# Patient Record
Sex: Female | Born: 1960 | Race: White | Hispanic: No | Marital: Married | Smoking: Current some day smoker
Health system: Southern US, Community
[De-identification: ages and names within clinical notes are randomized; demographics above are authoritative.]

## PROBLEM LIST (undated history)

## (undated) DIAGNOSIS — N644 Mastodynia: Secondary | ICD-10-CM

---

## 2004-10-10 ENCOUNTER — Ambulatory Visit: Payer: Self-pay | Admitting: Obstetrics and Gynecology

## 2004-11-08 ENCOUNTER — Emergency Department (HOSPITAL_COMMUNITY): Admission: EM | Admit: 2004-11-08 | Discharge: 2004-11-08 | Payer: Self-pay | Admitting: Emergency Medicine

## 2005-12-01 ENCOUNTER — Ambulatory Visit: Payer: Self-pay | Admitting: Obstetrics and Gynecology

## 2007-02-03 ENCOUNTER — Ambulatory Visit: Payer: Self-pay | Admitting: Obstetrics and Gynecology

## 2008-02-28 ENCOUNTER — Ambulatory Visit: Payer: Self-pay | Admitting: Obstetrics and Gynecology

## 2009-07-12 ENCOUNTER — Ambulatory Visit: Payer: Self-pay | Admitting: Obstetrics and Gynecology

## 2009-07-24 ENCOUNTER — Ambulatory Visit: Payer: Self-pay | Admitting: Obstetrics and Gynecology

## 2010-09-23 ENCOUNTER — Ambulatory Visit: Payer: Self-pay | Admitting: Obstetrics and Gynecology

## 2011-07-15 ENCOUNTER — Ambulatory Visit: Payer: Self-pay

## 2011-11-05 ENCOUNTER — Ambulatory Visit: Payer: Self-pay | Admitting: Obstetrics and Gynecology

## 2013-01-11 ENCOUNTER — Ambulatory Visit: Payer: Self-pay | Admitting: Obstetrics and Gynecology

## 2014-07-03 ENCOUNTER — Ambulatory Visit: Payer: Self-pay | Admitting: Obstetrics and Gynecology

## 2015-09-12 ENCOUNTER — Other Ambulatory Visit: Payer: Self-pay | Admitting: Obstetrics and Gynecology

## 2015-09-12 DIAGNOSIS — N63 Unspecified lump in unspecified breast: Secondary | ICD-10-CM

## 2015-09-24 ENCOUNTER — Ambulatory Visit
Admission: RE | Admit: 2015-09-24 | Discharge: 2015-09-24 | Disposition: A | Discharge status: Home / Self Care | Payer: BC Managed Care – PPO | Source: Ambulatory Visit | Attending: Obstetrics and Gynecology | Admitting: Obstetrics and Gynecology

## 2015-09-24 DIAGNOSIS — N63 Unspecified lump in unspecified breast: Secondary | ICD-10-CM

## 2015-09-24 DIAGNOSIS — R928 Other abnormal and inconclusive findings on diagnostic imaging of breast: Secondary | ICD-10-CM | POA: Diagnosis not present

## 2015-09-24 HISTORY — DX: Mastodynia: N64.4

## 2016-10-06 ENCOUNTER — Other Ambulatory Visit: Payer: Self-pay | Admitting: Obstetrics and Gynecology

## 2016-10-06 DIAGNOSIS — Z1231 Encounter for screening mammogram for malignant neoplasm of breast: Secondary | ICD-10-CM

## 2016-11-04 ENCOUNTER — Ambulatory Visit
Admission: RE | Admit: 2016-11-04 | Discharge: 2016-11-04 | Disposition: A | Discharge status: Home / Self Care | Payer: BC Managed Care – PPO | Source: Ambulatory Visit | Attending: Obstetrics and Gynecology | Admitting: Obstetrics and Gynecology

## 2016-11-04 DIAGNOSIS — Z1231 Encounter for screening mammogram for malignant neoplasm of breast: Secondary | ICD-10-CM | POA: Insufficient documentation

## 2017-05-12 ENCOUNTER — Emergency Department: Payer: BC Managed Care – PPO

## 2017-05-12 ENCOUNTER — Encounter: Payer: Self-pay | Admitting: *Deleted

## 2017-05-12 ENCOUNTER — Emergency Department
Admission: EM | Admit: 2017-05-12 | Discharge: 2017-05-12 | Disposition: A | Discharge status: Home / Self Care | Payer: BC Managed Care – PPO | Attending: Emergency Medicine | Admitting: Emergency Medicine

## 2017-05-12 DIAGNOSIS — N200 Calculus of kidney: Secondary | ICD-10-CM | POA: Diagnosis not present

## 2017-05-12 DIAGNOSIS — R109 Unspecified abdominal pain: Secondary | ICD-10-CM | POA: Diagnosis present

## 2017-05-12 DIAGNOSIS — F17210 Nicotine dependence, cigarettes, uncomplicated: Secondary | ICD-10-CM | POA: Diagnosis not present

## 2017-05-12 LAB — URINALYSIS, COMPLETE (UACMP) WITH MICROSCOPIC
BILIRUBIN URINE: NEGATIVE
Bacteria, UA: NONE SEEN
GLUCOSE, UA: NEGATIVE mg/dL
KETONES UR: 5 mg/dL — AB
LEUKOCYTES UA: NEGATIVE
Nitrite: POSITIVE — AB
PH: 5 (ref 5.0–8.0)
PROTEIN: NEGATIVE mg/dL
Specific Gravity, Urine: 1.011 (ref 1.005–1.030)

## 2017-05-12 MED ORDER — ONDANSETRON 4 MG PO TBDP
4.0000 mg | ORAL_TABLET | Freq: Once | ORAL | Status: AC
  Administered 2017-05-12: 4 mg via ORAL
  Filled 2017-05-12: qty 1

## 2017-05-12 MED ORDER — OXYCODONE-ACETAMINOPHEN 5-325 MG PO TABS
1.0000 | ORAL_TABLET | Freq: Once | ORAL | Status: AC
  Administered 2017-05-12: 1 via ORAL
  Filled 2017-05-12: qty 1

## 2017-05-12 MED ORDER — KETOROLAC TROMETHAMINE 30 MG/ML IJ SOLN
30.0000 mg | Freq: Once | INTRAMUSCULAR | Status: AC
  Administered 2017-05-12: 30 mg via INTRAMUSCULAR
  Filled 2017-05-12: qty 1

## 2017-05-12 MED ORDER — OXYCODONE-ACETAMINOPHEN 5-325 MG PO TABS: 1.0000 | ORAL_TABLET | Freq: Four times a day (QID) | ORAL | 0 refills | Status: AC | PRN

## 2017-05-12 MED ORDER — ONDANSETRON 4 MG PO TBDP: 4.0000 mg | ORAL_TABLET | Freq: Three times a day (TID) | ORAL | 0 refills | Status: AC | PRN

## 2017-05-12 NOTE — Discharge Instructions (Addendum)
Please keep your appointment to see Dr. Yves Dill tomorrow as scheduled. Return to the emergency department for any concerns such as fevers, chills, worsening pain, or for any other issues.  Please stop taking your ciprofloxacin - you do not have a urinary tract infection.  You can also take 600 mg of ibuprofen 3 times a day as needed for pain and only use your Percocet as needed for severe pain.  It was a pleasure to take care of you today, and thank you for coming to our emergency department.  If you have any questions or concerns before leaving please ask the nurse to grab me and I'm more than happy to go through your aftercare instructions again.  If you were prescribed any opioid pain medication today such as Norco, Vicodin, Percocet, morphine, hydrocodone, or oxycodone please make sure you do not drive when you are taking this medication as it can alter your ability to drive safely.  If you have any concerns once you are home that you are not improving or are in fact getting worse before you can make it to your follow-up appointment, please do not hesitate to call 911 and come back for further evaluation.  Darel Hong, MD  No results found for this or any previous visit. Ct Renal Stone Study  Result Date: 05/12/2017 CLINICAL DATA:  Left flank pain for several days. Vomiting. No history of stones. Recently treated for urinary tract infection. Previous cholecystectomy. EXAM: CT ABDOMEN AND PELVIS WITHOUT CONTRAST TECHNIQUE: Multidetector CT imaging of the abdomen and pelvis was performed following the standard protocol without IV contrast. COMPARISON:  None. FINDINGS: Lower chest: No pulmonary nodules, pleural effusions, or infiltrates. Heart size is normal. No imaged pericardial effusion or significant coronary artery calcifications. Hepatobiliary: Status post cholecystectomy. Small left hepatic lobe cyst is identified at the dome. Pancreas: Unremarkable. No pancreatic ductal dilatation or  surrounding inflammatory changes. Spleen: Normal in size without focal abnormality. Adrenals/Urinary Tract: The adrenal glands are normal in appearance. There is left-sided hydronephrosis and hydroureter secondary to a 3 mm calculus at the distal most ureterovesical junction. There is significant edema in the region of the UVJ. A lower pole 3 mm intrarenal calculus is identified in the right kidney, not associated with obstruction. There is no right-sided hydronephrosis. No renal mass. Stomach/Bowel: The stomach and small bowel loops are normal in appearance. There are numerous colonic diverticula. No acute diverticulitis. The appendix is well seen and has a normal appearance. Vascular/Lymphatic: There is atherosclerotic calcification of the abdominal aorta not associated with aneurysm. No retroperitoneal or mesenteric adenopathy. Reproductive: Uterus is present and normal in CT appearance. No adnexal mass. Other: No free pelvic fluid. Anterior abdominal wall is unremarkable. Musculoskeletal: No acute or significant osseous findings. IMPRESSION: 1. Obstructing left ureterovesical junction calculus measuring 3 mm. 2. Nonobstructing lower pole right renal calculus. 3. Status post cholecystectomy. 4.  Aortic atherosclerosis.  (ICD10-I70.0) 5. Normal appendix. Electronically Signed   By: Nolon Nations M.D.   On: 05/12/2017 22:02

## 2017-05-12 NOTE — ED Notes (Signed)
Pt states Saturday felt like she had a UTI, states pressure started Sunday. States pain started LLQ/pelvic. Went to walk in clinic and was told blood in urine and bacteria. Was told she might had a kidney stone, denies hx of kidney stones. States has had UTI before and states this feels worse. Pt is holding L flank currently, lying on R side. States N&V as well. Alert, oriented, ambulatory.

## 2017-05-12 NOTE — ED Provider Notes (Signed)
[facility] Emergency Department Provider Note  ____________________________________________   First MD Initiated Contact with Patient 05/12/17 2123     (approximate)  I have reviewed the triage vital signs and the nursing notes.   HISTORY  Chief Complaint Flank Pain    HPI Raven Garza is a 56 y.o. female ho comes to the emergency Department with roughly 24 hours of severe left flank pain radiating to her left groin. She had several days of dysuria and yesterday went to the walk-in clinic where she had urinalysis positive for blood and a small amount of bacteria and she was begun on ciprofloxacin for presumed infection.No fevers or chills. She has no history of renal colic. Her pain is been slowly progressive and is now severe. Nothing seems to make it better or worse.she made an appointment to follow-up with urology tomorrow however tonight the pain was so severe she could not wait.   Past Medical History:  Diagnosis Date  . Breast pain x 1 month   not as painful currently    There are no active problems to display for this patient.   No past surgical history on file.  Prior to Admission medications   Medication Sig Start Date End Date Taking? Authorizing Provider  ondansetron (ZOFRAN ODT) 4 MG disintegrating tablet Take 1 tablet (4 mg total) by mouth every 8 (eight) hours as needed for nausea or vomiting. 05/12/17   Darel Hong, MD  oxyCODONE-acetaminophen (ROXICET) 5-325 MG tablet Take 1 tablet by mouth every 6 (six) hours as needed for severe pain. 05/12/17   Darel Hong, MD    Allergies Patient has no known allergies.  Family History  Problem Relation Age of Onset  . Ovarian cancer Mother 70  . Breast cancer Cousin 27    Social History Social History  Substance Use Topics  . Smoking status: Current Some Day Smoker  . Smokeless tobacco: Never Used  . Alcohol use No    Review of Systems Constitutional: No  fever/chills Eyes: No visual changes. ENT: No sore throat. Cardiovascular: Denies chest pain. Respiratory: Denies shortness of breath. Gastrointestinal: No abdominal pain.  Positive nausea, no vomiting.  No diarrhea.  No constipation. Genitourinary: positive for dysuria. Musculoskeletal: positive for back pain. Skin: Negative for rash. Neurological: Negative for headaches, focal weakness or numbness.   ____________________________________________   PHYSICAL EXAM:  VITAL SIGNS: ED Triage Vitals  Enc Vitals Group     BP 05/12/17 2107 (!) 173/91     Pulse Rate 05/12/17 2107 98     Resp 05/12/17 2107 (!) 24     Temp --      Temp src --      SpO2 05/12/17 2107 97 %     Weight 05/12/17 2108 155 lb (70.3 kg)     Height 05/12/17 2108 5\' 4"  (1.626 m)     Head Circumference --      Peak Flow --      Pain Score 05/12/17 2112 9     Pain Loc --      Pain Edu? --      Excl. in [place]? --     Constitutional: alert and oriented 4 appears quite uncomfortable lying on her right side splinting and holding her left flank Eyes: PERRL EOMI. Head: Atraumatic. Nose: No congestion/rhinnorhea. Mouth/Throat: No trismus Neck: No stridor.   Cardiovascular: Normal rate, regular rhythm. Grossly normal heart sounds.  Good peripheral circulation. Respiratory: increased respiratory effort.  No retractions. Lungs CTAB and  moving good air Gastrointestinal: soft nondistended nontender no rebound or guarding no peritonitis no costovertebral tenderness Musculoskeletal: No lower extremity edema   Neurologic:  Normal speech and language. No gross focal neurologic deficits are appreciated. Skin:  Skin is warm, dry and intact. No rash noted. Psychiatric: Mood and affect are normal. Speech and behavior are normal.    ____________________________________________   DIFFERENTIAL includes but not limited to  Renal colic, pyelonephritis, urinary tract infection,  AAA ____________________________________________   LABS (all labs ordered are listed, but only abnormal results are displayed)  Labs Reviewed  URINALYSIS, COMPLETE (UACMP) WITH MICROSCOPIC - Abnormal; Notable for the following:       Result Value   Color, Urine AMBER (*)    APPearance CLEAR (*)    Hgb urine dipstick MODERATE (*)    Ketones, ur 5 (*)    Nitrite POSITIVE (*)    Squamous Epithelial / LPF 0-5 (*)    All other components within normal limits  URINE CULTURE     __________________________________________  EKG   ____________________________________________  RADIOLOGY  CT scan shows a 3 mm left sided kidney stone at the UVJ ____________________________________________   PROCEDURES  Procedure(s) performed: no  Procedures  Critical Care performed: no  Observation: no ____________________________________________   INITIAL IMPRESSION / ASSESSMENT AND PLAN / ED COURSE  Pertinent labs & imaging results that were available during my care of the patient were reviewed by me and considered in my medical decision making (see chart for details).  The patient's history and exam are most consistent with renal colic. I reviewed her urinalysis yesterday and it is negative for nitrites negative for leukocytes and has no white blood cells. I do not believe she has a urinary tract infection.     After Toradol and Percocet the patient's pain is nearly completely resolved. She has a 3 mm stone at the left UVJ. She already has an appointment to see the urologist tomorrow morning. Fever precautions given and she'll be discharged hoe with Percocet and Zofran. Her husband is driving. ____________________________________________   FINAL CLINICAL IMPRESSION(S) / ED DIAGNOSES  Final diagnoses:  Kidney stone      NEW MEDICATIONS STARTED DURING THIS VISIT:  Discharge Medication List as of 05/12/2017 10:19 PM    START taking these medications   Details  ondansetron  (ZOFRAN ODT) 4 MG disintegrating tablet Take 1 tablet (4 mg total) by mouth every 8 (eight) hours as needed for nausea or vomiting., Starting Wed 05/12/2017, Print    oxyCODONE-acetaminophen (ROXICET) 5-325 MG tablet Take 1 tablet by mouth every 6 (six) hours as needed for severe pain., Starting Wed 05/12/2017, Print         Note:  This document was prepared using Dragon voice recognition software and may include unintentional dictation errors.     Darel Hong, MD 05/12/17 2246

## 2017-05-12 NOTE — ED Triage Notes (Signed)
Pt has left flank pain.  Sx for several days.  Pt vomiting in triage.  No hx of kidney stones.  Pt seen in urgent care yesterday and treated for uti sx.

## 2017-05-14 LAB — URINE CULTURE: Culture: NO GROWTH

## 2017-10-07 ENCOUNTER — Other Ambulatory Visit: Payer: Self-pay | Admitting: Obstetrics and Gynecology

## 2017-10-07 DIAGNOSIS — Z1231 Encounter for screening mammogram for malignant neoplasm of breast: Secondary | ICD-10-CM

## 2017-11-05 ENCOUNTER — Ambulatory Visit
Admission: RE | Admit: 2017-11-05 | Discharge: 2017-11-05 | Disposition: A | Discharge status: Home / Self Care | Payer: BC Managed Care – PPO | Source: Ambulatory Visit | Attending: Obstetrics and Gynecology | Admitting: Obstetrics and Gynecology

## 2017-11-05 DIAGNOSIS — Z1231 Encounter for screening mammogram for malignant neoplasm of breast: Secondary | ICD-10-CM | POA: Insufficient documentation

## 2017-11-05 DIAGNOSIS — R928 Other abnormal and inconclusive findings on diagnostic imaging of breast: Secondary | ICD-10-CM | POA: Insufficient documentation

## 2017-11-11 ENCOUNTER — Other Ambulatory Visit: Payer: Self-pay | Admitting: Obstetrics and Gynecology

## 2017-11-11 DIAGNOSIS — R928 Other abnormal and inconclusive findings on diagnostic imaging of breast: Secondary | ICD-10-CM

## 2017-11-11 DIAGNOSIS — N6310 Unspecified lump in the right breast, unspecified quadrant: Secondary | ICD-10-CM

## 2017-11-12 ENCOUNTER — Ambulatory Visit
Admission: RE | Admit: 2017-11-12 | Discharge: 2017-11-12 | Disposition: A | Discharge status: Home / Self Care | Payer: BC Managed Care – PPO | Source: Ambulatory Visit | Attending: Obstetrics and Gynecology | Admitting: Obstetrics and Gynecology

## 2017-11-12 DIAGNOSIS — N6310 Unspecified lump in the right breast, unspecified quadrant: Secondary | ICD-10-CM

## 2017-11-12 DIAGNOSIS — N6314 Unspecified lump in the right breast, lower inner quadrant: Secondary | ICD-10-CM | POA: Diagnosis not present

## 2017-11-12 DIAGNOSIS — R928 Other abnormal and inconclusive findings on diagnostic imaging of breast: Secondary | ICD-10-CM | POA: Diagnosis present

## 2017-11-16 ENCOUNTER — Other Ambulatory Visit: Payer: Self-pay | Admitting: Obstetrics and Gynecology

## 2017-11-16 DIAGNOSIS — N6310 Unspecified lump in the right breast, unspecified quadrant: Secondary | ICD-10-CM

## 2017-11-16 DIAGNOSIS — R928 Other abnormal and inconclusive findings on diagnostic imaging of breast: Secondary | ICD-10-CM

## 2017-11-19 ENCOUNTER — Ambulatory Visit
Admission: RE | Admit: 2017-11-19 | Discharge: 2017-11-19 | Disposition: A | Discharge status: Home / Self Care | Payer: BC Managed Care – PPO | Source: Ambulatory Visit | Attending: Obstetrics and Gynecology | Admitting: Obstetrics and Gynecology

## 2017-11-19 DIAGNOSIS — D241 Benign neoplasm of right breast: Secondary | ICD-10-CM | POA: Insufficient documentation

## 2017-11-19 DIAGNOSIS — N6081 Other benign mammary dysplasias of right breast: Secondary | ICD-10-CM | POA: Insufficient documentation

## 2017-11-19 DIAGNOSIS — N6314 Unspecified lump in the right breast, lower inner quadrant: Secondary | ICD-10-CM | POA: Diagnosis present

## 2017-11-19 DIAGNOSIS — R928 Other abnormal and inconclusive findings on diagnostic imaging of breast: Secondary | ICD-10-CM

## 2017-11-19 DIAGNOSIS — N6310 Unspecified lump in the right breast, unspecified quadrant: Secondary | ICD-10-CM

## 2017-11-19 HISTORY — PX: BREAST BIOPSY: SHX20

## 2017-11-22 LAB — SURGICAL PATHOLOGY

## 2017-12-20 ENCOUNTER — Other Ambulatory Visit (HOSPITAL_COMMUNITY): Payer: Self-pay | Admitting: General Surgery

## 2017-12-20 DIAGNOSIS — D241 Benign neoplasm of right breast: Secondary | ICD-10-CM

## 2018-09-02 IMAGING — MG MM DIGITAL SCREENING BILAT W/ TOMO W/ CAD
8 of 13 series · 8 of 29 positions shown · non-contrast
Comparison: Previous exam(s).

CLINICAL DATA: Screening.

EXAM:
DIGITAL SCREENING BILATERAL MAMMOGRAM WITH TOMO AND CAD

[L MLO (1 of 2)]
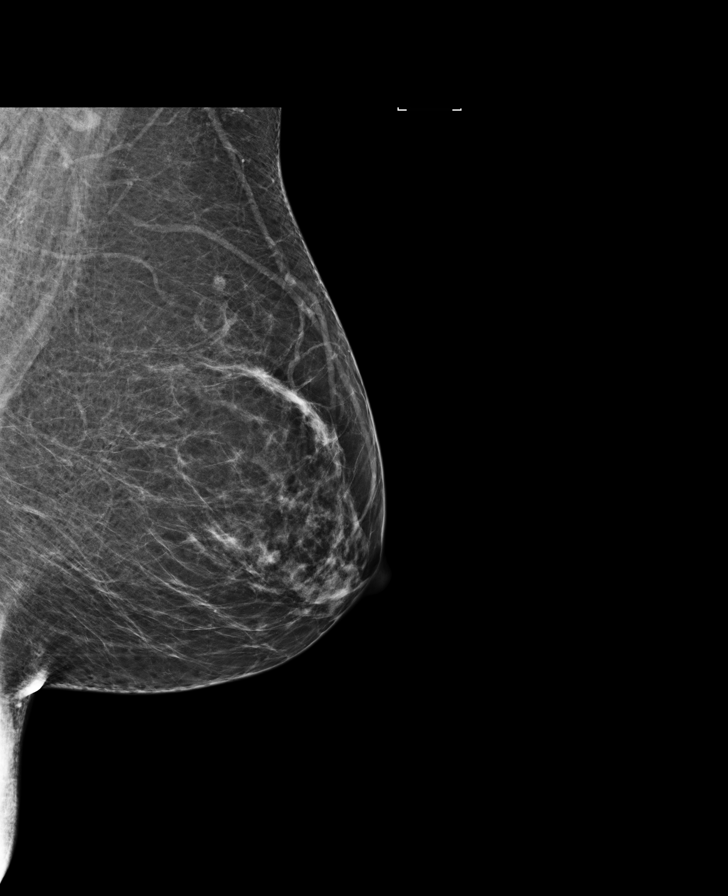

[L CC]
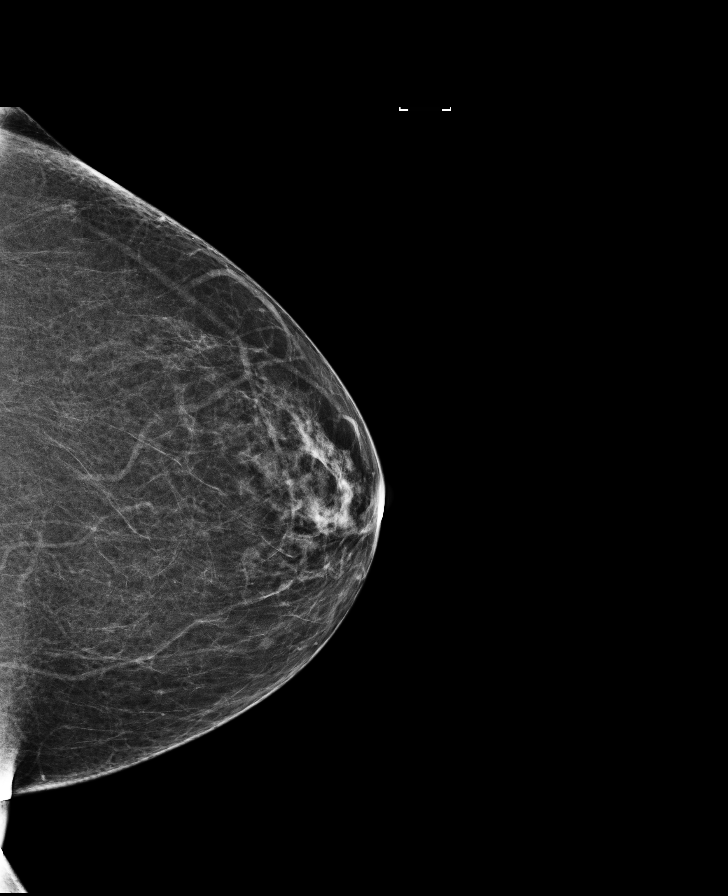

[L CC synth-2D]
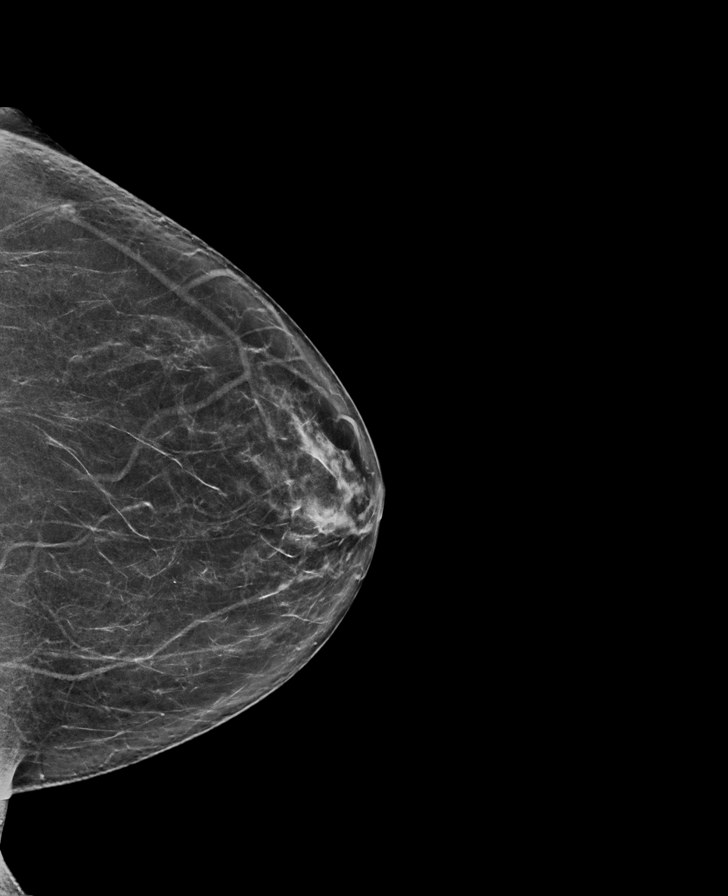

[L MLO (2 of 2)]
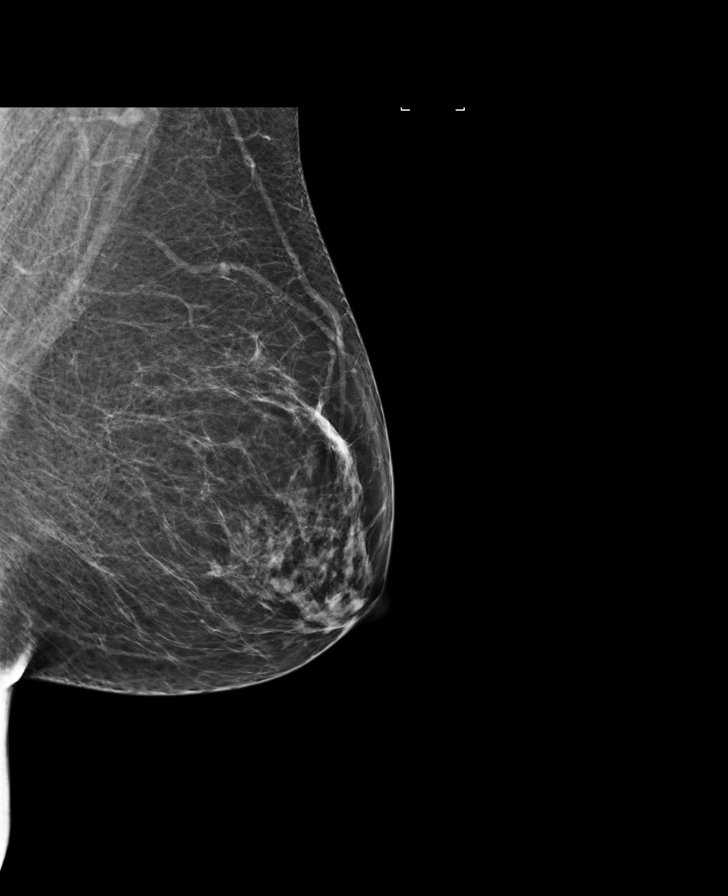

[R MLO synth-2D]
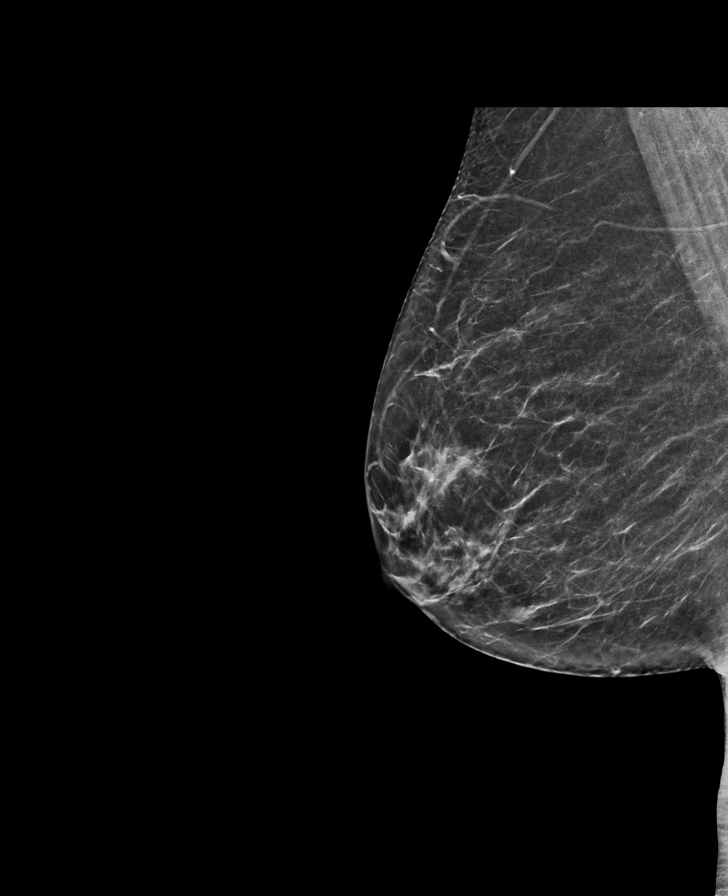

[R CC synth-2D]
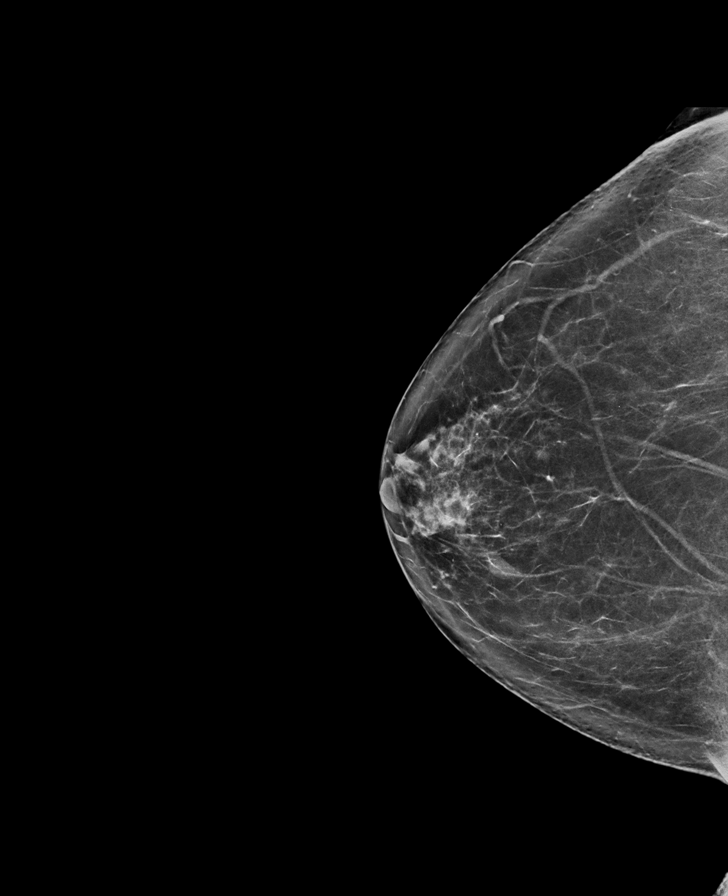

[L MLO synth-2D]
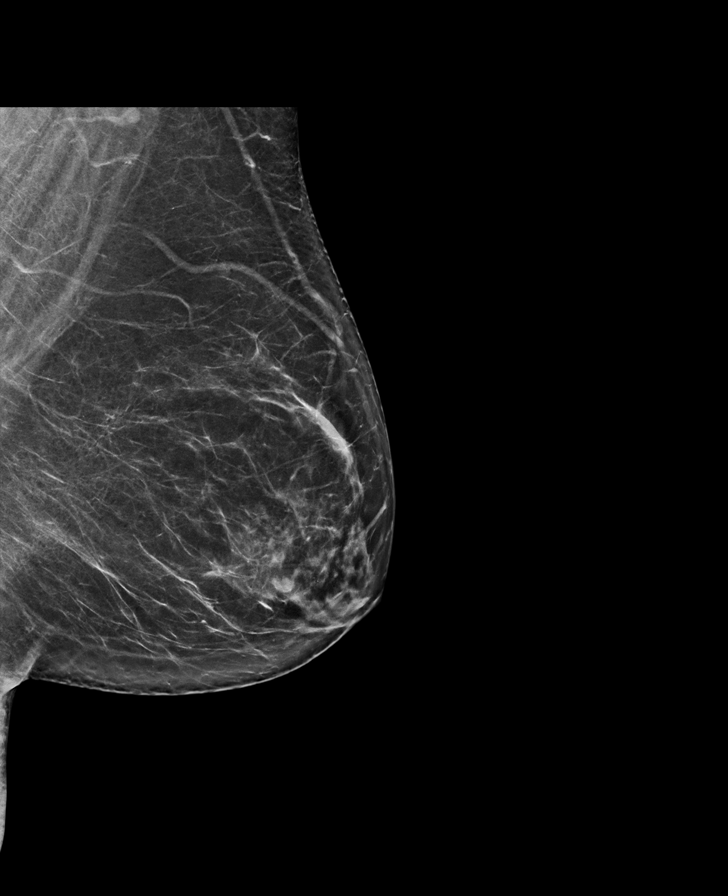

[R MLO]
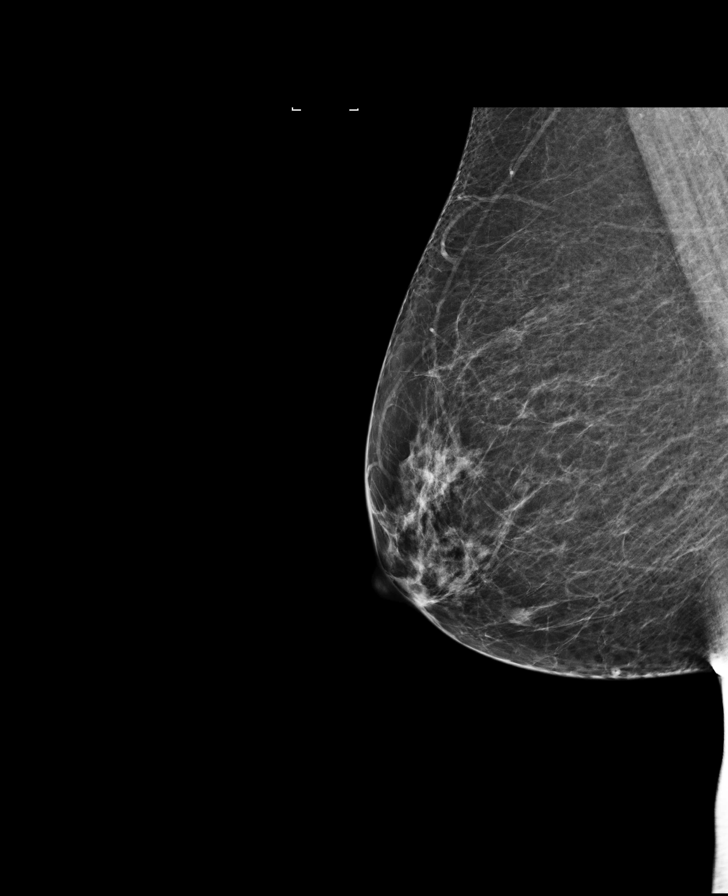

[8 of 29 positions shown; findings below may reference images not displayed]

ACR Breast Density Category b: There are scattered areas of
fibroglandular density.
FINDINGS: In the right breast, a possible mass warrants further evaluation. In
the left breast, no findings suspicious for malignancy. Images were
processed with CAD.
IMPRESSION: Further evaluation is suggested for possible mass in the right
breast.

RECOMMENDATION:
Ultrasound of the right breast. (Code:[id number]-UU2)

The patient will be contacted regarding the findings, and additional
imaging will be scheduled.

BI-RADS CATEGORY  0: Incomplete. Need additional imaging evaluation
and/or prior mammograms for comparison.

## 2020-09-19 ENCOUNTER — Other Ambulatory Visit: Payer: Self-pay | Admitting: Obstetrics and Gynecology

## 2020-09-19 DIAGNOSIS — Z1231 Encounter for screening mammogram for malignant neoplasm of breast: Secondary | ICD-10-CM

## 2020-11-20 ENCOUNTER — Ambulatory Visit
Admission: RE | Admit: 2020-11-20 | Discharge: 2020-11-20 | Disposition: A | Discharge status: Home / Self Care | Payer: BC Managed Care – PPO | Source: Ambulatory Visit | Attending: Obstetrics and Gynecology | Admitting: Obstetrics and Gynecology

## 2020-11-20 ENCOUNTER — Other Ambulatory Visit: Payer: Self-pay

## 2020-11-20 DIAGNOSIS — Z1231 Encounter for screening mammogram for malignant neoplasm of breast: Secondary | ICD-10-CM | POA: Insufficient documentation

## 2021-09-25 ENCOUNTER — Other Ambulatory Visit: Payer: Self-pay | Admitting: Obstetrics and Gynecology

## 2021-09-25 DIAGNOSIS — Z1231 Encounter for screening mammogram for malignant neoplasm of breast: Secondary | ICD-10-CM

## 2021-11-27 ENCOUNTER — Ambulatory Visit
Admission: RE | Admit: 2021-11-27 | Discharge: 2021-11-27 | Disposition: A | Discharge status: Home / Self Care | Payer: BC Managed Care – PPO | Source: Ambulatory Visit | Attending: Obstetrics and Gynecology | Admitting: Obstetrics and Gynecology

## 2021-11-27 DIAGNOSIS — Z1231 Encounter for screening mammogram for malignant neoplasm of breast: Secondary | ICD-10-CM | POA: Insufficient documentation

## 2022-04-27 ENCOUNTER — Other Ambulatory Visit: Payer: Self-pay | Admitting: Otolaryngology

## 2022-04-27 DIAGNOSIS — H918X9 Other specified hearing loss, unspecified ear: Secondary | ICD-10-CM

## 2022-05-08 ENCOUNTER — Ambulatory Visit
Admission: RE | Admit: 2022-05-08 | Discharge: 2022-05-08 | Disposition: A | Discharge status: Home / Self Care | Payer: BC Managed Care – PPO | Source: Ambulatory Visit | Attending: Otolaryngology | Admitting: Otolaryngology

## 2022-05-08 DIAGNOSIS — H918X9 Other specified hearing loss, unspecified ear: Secondary | ICD-10-CM

## 2022-05-13 ENCOUNTER — Other Ambulatory Visit: Payer: Self-pay | Admitting: Otolaryngology

## 2022-05-13 DIAGNOSIS — H918X9 Other specified hearing loss, unspecified ear: Secondary | ICD-10-CM

## 2022-11-10 ENCOUNTER — Other Ambulatory Visit: Payer: Self-pay | Admitting: Obstetrics and Gynecology

## 2022-11-10 DIAGNOSIS — Z1231 Encounter for screening mammogram for malignant neoplasm of breast: Secondary | ICD-10-CM

## 2022-12-01 ENCOUNTER — Ambulatory Visit
Admission: RE | Admit: 2022-12-01 | Discharge: 2022-12-01 | Disposition: A | Discharge status: Home / Self Care | Payer: BC Managed Care – PPO | Source: Ambulatory Visit | Attending: Obstetrics and Gynecology | Admitting: Obstetrics and Gynecology

## 2022-12-01 DIAGNOSIS — Z1231 Encounter for screening mammogram for malignant neoplasm of breast: Secondary | ICD-10-CM | POA: Insufficient documentation

## 2023-11-16 ENCOUNTER — Other Ambulatory Visit: Payer: Self-pay | Admitting: Obstetrics and Gynecology

## 2023-11-16 DIAGNOSIS — Z1231 Encounter for screening mammogram for malignant neoplasm of breast: Secondary | ICD-10-CM

## 2023-12-07 ENCOUNTER — Ambulatory Visit
Admission: RE | Admit: 2023-12-07 | Discharge: 2023-12-07 | Disposition: A | Discharge status: Home / Self Care | Source: Ambulatory Visit | Attending: Obstetrics and Gynecology | Admitting: Obstetrics and Gynecology

## 2023-12-07 DIAGNOSIS — Z1231 Encounter for screening mammogram for malignant neoplasm of breast: Secondary | ICD-10-CM | POA: Diagnosis present

## 2023-12-24 ENCOUNTER — Ambulatory Visit: Payer: Self-pay

## 2023-12-24 DIAGNOSIS — K219 Gastro-esophageal reflux disease without esophagitis: Secondary | ICD-10-CM | POA: Diagnosis present

## 2023-12-24 DIAGNOSIS — R1013 Epigastric pain: Secondary | ICD-10-CM | POA: Diagnosis not present

## 2023-12-24 DIAGNOSIS — K2970 Gastritis, unspecified, without bleeding: Secondary | ICD-10-CM | POA: Diagnosis not present

## 2024-05-16 ENCOUNTER — Ambulatory Visit (LOCAL_COMMUNITY_HEALTH_CENTER): Payer: Self-pay

## 2024-05-16 DIAGNOSIS — Z111 Encounter for screening for respiratory tuberculosis: Secondary | ICD-10-CM

## 2024-05-16 NOTE — Progress Notes (Signed)
 Tuberculin skin test applied to left ventral forearm.  Explained not to use lotions or creams, place bandages or wraps on testing area.  Advised, if bleeding occurs, to lightly dab testing area and given 2x2 gauze in case of leakage or bleeding.  Kandi KATHEE Glatter, RN

## 2024-05-18 ENCOUNTER — Ambulatory Visit (LOCAL_COMMUNITY_HEALTH_CENTER): Payer: Self-pay

## 2024-05-18 ENCOUNTER — Other Ambulatory Visit: Payer: Self-pay

## 2024-05-18 DIAGNOSIS — Z111 Encounter for screening for respiratory tuberculosis: Secondary | ICD-10-CM

## 2024-05-18 LAB — TB SKIN TEST
Induration: 0 mm
TB Skin Test: NEGATIVE
